# Patient Record
Sex: Male | Born: 1980 | Race: Black or African American | Hispanic: No | Marital: Married | Smoking: Never smoker
Health system: Southern US, Community
[De-identification: ages and names within clinical notes are randomized; demographics above are authoritative.]

## PROBLEM LIST (undated history)

## (undated) HISTORY — PX: FRACTURE SURGERY: SHX138

## (undated) HISTORY — PX: SPINE SURGERY: SHX786

---

## 2006-03-07 ENCOUNTER — Emergency Department (HOSPITAL_COMMUNITY): Admission: EM | Admit: 2006-03-07 | Discharge: 2006-03-07 | Payer: Self-pay | Admitting: Emergency Medicine

## 2006-03-27 ENCOUNTER — Emergency Department (HOSPITAL_COMMUNITY): Admission: EM | Admit: 2006-03-27 | Discharge: 2006-03-27 | Payer: Self-pay | Admitting: Emergency Medicine

## 2011-09-13 DIAGNOSIS — B86 Scabies: Secondary | ICD-10-CM | POA: Insufficient documentation

## 2011-09-14 ENCOUNTER — Encounter: Payer: Self-pay | Admitting: *Deleted

## 2011-09-14 ENCOUNTER — Emergency Department (HOSPITAL_COMMUNITY)
Admission: EM | Admit: 2011-09-14 | Discharge: 2011-09-14 | Disposition: A | Discharge status: Home / Self Care | Payer: Self-pay | Attending: Emergency Medicine | Admitting: Emergency Medicine

## 2011-09-14 DIAGNOSIS — B86 Scabies: Secondary | ICD-10-CM

## 2011-09-14 MED ORDER — PERMETHRIN 5 % EX CREA: TOPICAL_CREAM | CUTANEOUS | Status: AC

## 2011-09-14 NOTE — ED Notes (Signed)
Itching rash for 1- 2 weeks.

## 2011-09-14 NOTE — ED Provider Notes (Signed)
History     CSN: [id number] Arrival date & time: 09/14/2011  2:09 AM   First MD Initiated Contact with Patient 09/14/11 [phone]      Chief Complaint  Patient presents with  . Rash    (Consider location/radiation/quality/duration/timing/severity/associated sxs/prior treatment) HPI Comments: One to 2 weeks of gradual onset of itching with a scabbing-type rash to the left arm. Symptoms are constant, gradually getting worse, not associated with fevers.  Patient is a 30 y.o. male presenting with rash. The history is provided by the patient and a friend.  Rash     History reviewed. No pertinent past medical history.  Past Surgical History  Procedure Date  . Fracture surgery     History reviewed. No pertinent family history.  History  Substance Use Topics  . Smoking status: Never Smoker   . Smokeless tobacco: Not on file  . Alcohol Use: No      Review of Systems  Constitutional: Negative for fever.  Skin: Positive for rash.    Allergies  Review of patient's allergies indicates no known allergies.  Home Medications   Current Outpatient Rx  Name Route Sig Dispense Refill  . PERMETHRIN 5 % EX CREA  Apply to body X 1 application and leave on for 8-12 hours, then wash off, repeat as needed in 7 days 60 g 1    BP 130/71  Temp 97.6 F (36.4 C)  Resp 20  SpO2 98%  Physical Exam  Constitutional: He appears well-developed and well-nourished. No distress.  Eyes: Conjunctivae are normal. Right eye exhibits no discharge. Left eye exhibits no discharge. No scleral icterus.  Pulmonary/Chest: Effort normal.  Musculoskeletal: Normal range of motion. He exhibits no edema and no tenderness.  Neurological: He is alert.       Speech clear, gait normal  Skin: Rash ( Scabbing-type rash to the left upper arm on the medial surface in the left wrist. No petechiae or purpura) noted. He is not diaphoretic.    ED Course  Procedures (including critical care time)  Labs Reviewed - No  data to display No results found.   1. Scabies       MDM  Appears consistent with scabies, has associated sacral attacks with similar symptoms        Vida Roller, MD 09/14/11 [phone]

## 2013-06-23 ENCOUNTER — Encounter (HOSPITAL_COMMUNITY): Payer: Self-pay | Admitting: *Deleted

## 2013-06-23 ENCOUNTER — Emergency Department (HOSPITAL_COMMUNITY)
Admission: EM | Admit: 2013-06-23 | Discharge: 2013-06-23 | Disposition: A | Discharge status: Home / Self Care | Payer: Self-pay | Attending: Emergency Medicine | Admitting: Emergency Medicine

## 2013-06-23 DIAGNOSIS — R51 Headache: Secondary | ICD-10-CM | POA: Insufficient documentation

## 2013-06-23 DIAGNOSIS — L729 Follicular cyst of the skin and subcutaneous tissue, unspecified: Secondary | ICD-10-CM

## 2013-06-23 DIAGNOSIS — D234 Other benign neoplasm of skin of scalp and neck: Secondary | ICD-10-CM | POA: Insufficient documentation

## 2013-06-23 MED ORDER — SODIUM CHLORIDE 0.9 % IV SOLN
1000.0000 mL | INTRAVENOUS | Status: DC
  Administered 2013-06-23: 1000 mL via INTRAVENOUS

## 2013-06-23 MED ORDER — DIPHENHYDRAMINE HCL 50 MG/ML IJ SOLN
25.0000 mg | Freq: Once | INTRAMUSCULAR | Status: AC
  Administered 2013-06-23: 25 mg via INTRAVENOUS
  Filled 2013-06-23: qty 1

## 2013-06-23 MED ORDER — SODIUM CHLORIDE 0.9 % IV SOLN
1000.0000 mL | Freq: Once | INTRAVENOUS | Status: AC
  Administered 2013-06-23: 1000 mL via INTRAVENOUS

## 2013-06-23 MED ORDER — METOCLOPRAMIDE HCL 5 MG/ML IJ SOLN
10.0000 mg | Freq: Once | INTRAMUSCULAR | Status: AC
  Administered 2013-06-23: 10 mg via INTRAVENOUS
  Filled 2013-06-23: qty 2

## 2013-06-23 MED ORDER — KETOROLAC TROMETHAMINE 30 MG/ML IJ SOLN
30.0000 mg | Freq: Once | INTRAMUSCULAR | Status: AC
  Administered 2013-06-23: 30 mg via INTRAVENOUS
  Filled 2013-06-23: qty 1

## 2013-06-23 NOTE — ED Notes (Signed)
Headache with blurred vision onset 2 days ago, states he has a knot on the right side of his head that started at the same time

## 2013-06-23 NOTE — ED Notes (Signed)
Patient relates noticing soft nodule on posterior scalp 2 days ago w/progressive throbbing headache, blurred vision and slight dizziness and minimal loss of balance.  No dysphagia, dysarthia.  Grips strong and equal bilaterally.  PERRLA at 2mm.

## 2013-06-23 NOTE — ED Provider Notes (Signed)
CSN: [id number]     Arrival date & time 06/23/13  1516 History     First MD Initiated Contact with Patient 06/23/13 1531     Chief Complaint  Patient presents with  . Headache   (Consider location/radiation/quality/duration/timing/severity/associated sxs/prior Treatment) HPI  Patient states he noted a "knot" in his right scalp about 2 or 3 days ago and also he has had a headache that is right sided. He states the knot is not tender. He denies nausea, vomiting, fever, change in vision. He denies any recent tick exposure. He denies any neck pain. He states he has had boils twice in the past but not recently. He states his current pain is a "6/10". He is not taking any medications for this at home. He states he's never had this before. He denies any known trauma to his scalp.  PCP none  History reviewed. No pertinent past medical history. Past Surgical History  Procedure Laterality Date  . Fracture surgery     No family history on file. History  Substance Use Topics  . Smoking status: Never Smoker   . Smokeless tobacco: Not on file  . Alcohol Use: No  lives with spouse employed  Review of Systems  All other systems reviewed and are negative.    Allergies  Review of patient's allergies indicates no known allergies.  Home Medications  No current outpatient prescriptions on file.   BP 96/63  Pulse 70  Temp(Src) 97.7 F (36.5 C) (Oral)  Resp 20  Ht 5\' 10"  (1.778 m)  Wt 190 lb (86.183 kg)  BMI 27.26 kg/m2  SpO2 100%  Vital signs normal except borderline hypotension  Physical Exam  Nursing note and vitals reviewed. Constitutional: He is oriented to person, place, and time. He appears well-developed.  HENT:  Head: Normocephalic and atraumatic.  Mouth/Throat: Oropharynx is clear and moist.  Eyes: Conjunctivae and EOM are normal. Pupils are equal, round, and reactive to light.  Neck: Normal range of motion. Neck supple.  Pulmonary/Chest: Effort normal. No respiratory  distress.  Musculoskeletal: Normal range of motion.  Lymphadenopathy:    He has no cervical adenopathy.  Neurological: He is alert and oriented to person, place, and time. No cranial nerve deficit.  Skin: Skin is warm and dry. No rash noted. No erythema. No pallor.  Patient has a very soft cystic area in his right posterior scalp. It is nontender to palpation. There is no inflammation of the skin overlying that area. There is a mild loss of hair in that area.  Psychiatric: He has a normal mood and affect. His behavior is normal.    ED Course   Medications  0.9 %  sodium chloride infusion (0 mLs Intravenous Stopped 06/23/13 1757)    Followed by  0.9 %  sodium chloride infusion (1,000 mLs Intravenous New Bag/Given 06/23/13 1802)  metoCLOPramide (REGLAN) injection 10 mg (10 mg Intravenous Given 06/23/13 1602)  diphenhydrAMINE (BENADRYL) injection 25 mg (25 mg Intravenous Given 06/23/13 1602)  ketorolac (TORADOL) 30 MG/ML injection 30 mg (30 mg Intravenous Given 06/23/13 1602)     Procedures (including critical care time)  Recheck at time of discharge shows patient is laying on his right side with his right scalp on the pillow. He states his headache is much improved. We discussed this feels like a benign cyst but if he feels like it's causing a problem he can discuss having it removed with a Careers adviser. We had discussed prior to giving him medications that he probably was  having headaches because he's sleeping on the right side of his head with pressure on the cyst.    1. Scalp cyst   2. Headache     Plan discharge  Devoria Albe, MD, FACEP    MDM    Ward Givens, MD 06/23/13 [phone]

## 2013-10-12 ENCOUNTER — Emergency Department (HOSPITAL_COMMUNITY)
Admission: EM | Admit: 2013-10-12 | Discharge: 2013-10-12 | Disposition: A | Discharge status: Home / Self Care | Payer: Self-pay | Attending: Emergency Medicine | Admitting: Emergency Medicine

## 2013-10-12 ENCOUNTER — Encounter (HOSPITAL_COMMUNITY): Payer: Self-pay | Admitting: Emergency Medicine

## 2013-10-12 ENCOUNTER — Emergency Department (HOSPITAL_COMMUNITY): Payer: Self-pay

## 2013-10-12 DIAGNOSIS — S161XXA Strain of muscle, fascia and tendon at neck level, initial encounter: Secondary | ICD-10-CM

## 2013-10-12 DIAGNOSIS — S139XXA Sprain of joints and ligaments of unspecified parts of neck, initial encounter: Secondary | ICD-10-CM | POA: Insufficient documentation

## 2013-10-12 DIAGNOSIS — W219XXA Striking against or struck by unspecified sports equipment, initial encounter: Secondary | ICD-10-CM | POA: Insufficient documentation

## 2013-10-12 DIAGNOSIS — Y9239 Other specified sports and athletic area as the place of occurrence of the external cause: Secondary | ICD-10-CM | POA: Insufficient documentation

## 2013-10-12 DIAGNOSIS — Y9361 Activity, american tackle football: Secondary | ICD-10-CM | POA: Insufficient documentation

## 2013-10-12 MED ORDER — CYCLOBENZAPRINE HCL 10 MG PO TABS
ORAL_TABLET | ORAL | Status: AC
  Filled 2013-10-12: qty 1

## 2013-10-12 MED ORDER — CYCLOBENZAPRINE HCL 10 MG PO TABS
10.0000 mg | ORAL_TABLET | Freq: Once | ORAL | Status: AC
  Administered 2013-10-12: 10 mg via ORAL

## 2013-10-12 MED ORDER — CYCLOBENZAPRINE HCL 10 MG PO TABS: 10.0000 mg | ORAL_TABLET | Freq: Two times a day (BID) | ORAL | Status: DC | PRN

## 2013-10-12 MED ORDER — IBUPROFEN 600 MG PO TABS: 600.0000 mg | ORAL_TABLET | Freq: Four times a day (QID) | ORAL | Status: DC | PRN

## 2013-10-12 NOTE — ED Provider Notes (Signed)
Medical screening examination/treatment/procedure(s) were performed by non-physician practitioner and as supervising physician I was immediately available for consultation/collaboration.  EKG Interpretation   None         Garrell Flagg L Laiana Fratus, MD 10/12/13 1542 

## 2013-10-12 NOTE — ED Provider Notes (Signed)
CSN: [id number]     Arrival date & time 10/12/13  1015 History   First MD Initiated Contact with Patient 10/12/13 1052     Chief Complaint  Patient presents with  . Neck Injury   (Consider location/radiation/quality/duration/timing/severity/associated sxs/prior Treatment) Patient is a 32 y.o. male presenting with neck injury. The history is provided by the patient.  Neck Injury This is a new problem. The current episode started yesterday. The problem occurs constantly.   Hector Ross is a 32 y.o. male who presents to the ED with neck pain. He was playing football yesterday and was the quarterback. Another player hit him and it pulled his neck under him. He complains of pain with any movement. The pain is in the back of the neck and radiates to the right shoulder. He denies any other injuries. No headache, no LOC. No numbness of tingling.   History reviewed. No pertinent past medical history. Past Surgical History  Procedure Laterality Date  . Fracture surgery     No family history on file. History  Substance Use Topics  . Smoking status: Never Smoker   . Smokeless tobacco: Not on file  . Alcohol Use: No    Review of Systems Negative except as stated in HPI.   Allergies  Review of patient's allergies indicates no known allergies.  Home Medications  No current outpatient prescriptions on file. BP 115/63  Pulse 59  Temp(Src) 97.8 F (36.6 C) (Oral)  Resp 16  Ht 5\' 10"  (1.778 m)  Wt 196 lb (88.905 kg)  BMI 28.12 kg/m2  SpO2 97% Physical Exam  Nursing note and vitals reviewed. Constitutional: He is oriented to person, place, and time. He appears well-developed and well-nourished. No distress.  HENT:  Head: Normocephalic.  Eyes: Conjunctivae and EOM are normal.  Neck: Neck supple. Muscular tenderness present. Decreased range of motion present.    Cardiovascular: Normal rate.   Pulmonary/Chest: Effort normal.  Abdominal: Soft. There is no tenderness.    Musculoskeletal:  Radial pulses equal, adequate circulation, good touch sensation.  Neurological: He is alert and oriented to person, place, and time. He has normal strength. No cranial nerve deficit or sensory deficit. Gait normal.  Reflex Scores:      Bicep reflexes are 2+ on the right side and 2+ on the left side.      Brachioradialis reflexes are 2+ on the right side and 2+ on the left side. Skin: Skin is warm and dry.  Psychiatric: He has a normal mood and affect. His behavior is normal.    ED Course  Procedures   EKG Interpretation   None     Ct Cervical Spine Wo Contrast  10/12/2013   CLINICAL DATA:  Right posterior neck pain. Injury while playing football yesterday.  EXAM: CT CERVICAL SPINE WITHOUT CONTRAST  TECHNIQUE: Multidetector CT imaging of the cervical spine was performed without intravenous contrast. Multiplanar CT image reconstructions were also generated.  COMPARISON:  None.  FINDINGS: Imaging was obtained from the skullbase to the T2 vertebral body. No evidence for fracture there is some loss of disc height at C4-5 and C5-6 with associated endplate spurring. Facets are well aligned bilaterally. No prevertebral soft tissue edema normal cervical lordosis is straightened.  IMPRESSION: No cervical spine fracture. Loss of cervical lordosis. This can be related to patient positioning, muscle spasm or soft tissue injury.   Electronically Signed   By: Kennith Center M.D.   On: 10/12/2013 12:08      MDM  32 y.o. male with cervical strain s/p foot ball injury. Will treat with muscle relaxants and NSAIDS. Patient will follow up with Dr. Hilda Lias if symptoms do not improve.  I have reviewed this patient's vital signs, nurses notes, appropriate imaging and discussed findings and plan of care with the patient. He voices understanding.   Medication List         cyclobenzaprine 10 MG tablet  Commonly known as:  FLEXERIL  Take 1 tablet (10 mg total) by mouth 2 (two) times daily as  needed for muscle spasms.     ibuprofen 600 MG tablet  Commonly known as:  ADVIL,MOTRIN  Take 1 tablet (600 mg total) by mouth every 6 (six) hours as needed.             Hector Napoleon, NP 10/12/13 [phone]

## 2013-10-12 NOTE — ED Notes (Signed)
Patient with no complaints at this time. Respirations even and unlabored. Skin warm/dry. Discharge instructions reviewed with patient at this time. Patient given opportunity to voice concerns/ask questions. Patient discharged at this time and left Emergency Department with steady gait.   

## 2013-10-12 NOTE — ED Notes (Signed)
Pt reports his injury his neck during a football game yesterday.  Pt denies any numbness or tingling.  Pt reports pain with movement.

## 2015-05-11 ENCOUNTER — Ambulatory Visit (INDEPENDENT_AMBULATORY_CARE_PROVIDER_SITE_OTHER): Payer: Worker's Compensation | Admitting: Family Medicine

## 2015-05-11 ENCOUNTER — Encounter (INDEPENDENT_AMBULATORY_CARE_PROVIDER_SITE_OTHER): Payer: Self-pay

## 2015-05-11 ENCOUNTER — Encounter: Payer: Self-pay | Admitting: Family Medicine

## 2015-05-11 VITALS — BP 113/64 | HR 64 | Temp 97.1°F | Wt 203.0 lb

## 2015-05-11 DIAGNOSIS — S0501XA Injury of conjunctiva and corneal abrasion without foreign body, right eye, initial encounter: Secondary | ICD-10-CM

## 2015-05-11 NOTE — Progress Notes (Signed)
Subjective:  Patient ID: Hector Ross, male    DOB: [birth year]  Age: 34 y.o. MRN: [id number]  CC: eye irritation   HPI Hector Ross presents for brick fragment flew into his right eye approximately 12:30 today. No pre-existing visual illnesses noted. Patient denies blurred vision now. He feels like there is something in the eye. It was irrigated with a bottle of eyewash immediately after the occurrence. He has pain in the right eye that is mild-to-moderate. Tearing but no discharge.  History Hector Ross has no past medical history on file.   He has past surgical history that includes Fracture surgery.   His family history is not on file.He reports that he has never smoked. He does not have any smokeless tobacco history on file. He reports that he does not drink alcohol or use illicit drugs.  Outpatient Prescriptions Prior to Visit  Medication Sig Dispense Refill  . cyclobenzaprine (FLEXERIL) 10 MG tablet Take 1 tablet (10 mg total) by mouth 2 (two) times daily as needed for muscle spasms. (Patient not taking: Reported on 05/11/2015) 20 tablet 0  . ibuprofen (ADVIL,MOTRIN) 600 MG tablet Take 1 tablet (600 mg total) by mouth every 6 (six) hours as needed. (Patient not taking: Reported on 05/11/2015) 30 tablet 0   No facility-administered medications prior to visit.    ROS Review of Systems  Constitutional: Negative for fever, chills and diaphoresis.  HENT: Negative for congestion, rhinorrhea and sore throat.   Eyes: Positive for pain and redness. Negative for photophobia, discharge, itching and visual disturbance.  Cardiovascular: Negative.   Gastrointestinal: Negative.   Skin: Negative for rash.    Objective:  BP 113/64 mmHg  Pulse 64  Temp(Src) 97.1 F (36.2 C) (Oral)  Wt 203 lb (92.08 kg)  BP Readings from Last 3 Encounters:  05/11/15 113/64  10/12/13 115/63  06/23/13 96/63    Wt Readings from Last 3 Encounters:  05/11/15 203 lb (92.08 kg)  10/12/13 196 lb (88.905 kg)   06/23/13 190 lb (86.183 kg)     Physical Exam  Constitutional: He appears well-developed and well-nourished. No distress.  HENT:  Head: Normocephalic and atraumatic.  Eyes: EOM are normal. Pupils are equal, round, and reactive to light. Right eye exhibits no discharge.  Width lamp exam reveals a 1 mm/pinprick size corneal abrasion without sign of perforation at the 5:00 position in the mid zone of the cornea.    No results found for: HGBA1C  No results found for: WBC, HGB, HCT, PLT, GLUCOSE, CHOL, TRIG, HDL, LDLDIRECT, LDLCALC, ALT, AST, NA, K, CL, CREATININE, BUN, CO2, TSH, PSA, INR, GLUF, HGBA1C, MICROALBUR  Ct Cervical Spine Wo Contrast  10/12/2013   CLINICAL DATA:  Right posterior neck pain. Injury while playing football yesterday.  EXAM: CT CERVICAL SPINE WITHOUT CONTRAST  TECHNIQUE: Multidetector CT imaging of the cervical spine was performed without intravenous contrast. Multiplanar CT image reconstructions were also generated.  COMPARISON:  None.  FINDINGS: Imaging was obtained from the skullbase to the T2 vertebral body. No evidence for fracture there is some loss of disc height at C4-5 and C5-6 with associated endplate spurring. Facets are well aligned bilaterally. No prevertebral soft tissue edema normal cervical lordosis is straightened.  IMPRESSION: No cervical spine fracture. Loss of cervical lordosis. This can be related to patient positioning, muscle spasm or soft tissue injury.   Electronically Signed   By: Misty Stanley M.D.   On: 10/12/2013 12:08    Assessment & Plan:   Hector Ross  was seen today for eye irritation.  Diagnoses and all orders for this visit:  Corneal abrasion, right, initial encounter  I have discontinued Hector Ross cyclobenzaprine and ibuprofen.  No orders of the defined types were placed in this encounter.   The eye was inspected for foreign body and lid was everted without any evidence for foreign body. I was irrigated. I patch with compression  applied to the right eye. Patient was instructed to leave the eye patch in place to morning. Follow up immediately for any change in vision increased eye pain or purulent discharge.  Follow-up: Return if symptoms worsen or fail to improve.  Claretta Fraise, M.D.

## 2016-09-04 ENCOUNTER — Encounter (HOSPITAL_COMMUNITY): Payer: Self-pay | Admitting: *Deleted

## 2016-09-04 ENCOUNTER — Emergency Department (HOSPITAL_COMMUNITY)
Admission: EM | Admit: 2016-09-04 | Discharge: 2016-09-04 | Disposition: A | Discharge status: Home / Self Care | Payer: Self-pay | Attending: Emergency Medicine | Admitting: Emergency Medicine

## 2016-09-04 ENCOUNTER — Emergency Department (HOSPITAL_COMMUNITY): Payer: Self-pay

## 2016-09-04 DIAGNOSIS — R0602 Shortness of breath: Secondary | ICD-10-CM | POA: Insufficient documentation

## 2016-09-04 DIAGNOSIS — R531 Weakness: Secondary | ICD-10-CM | POA: Insufficient documentation

## 2016-09-04 DIAGNOSIS — R079 Chest pain, unspecified: Secondary | ICD-10-CM | POA: Insufficient documentation

## 2016-09-04 LAB — TROPONIN I: Troponin I: <0.03 ng/mL (ref <0.03)

## 2016-09-04 LAB — CBC
HEMATOCRIT: 41.4 % (ref 39.0–52.0)
HEMOGLOBIN: 13.6 g/dL (ref 13.0–17.0)
MCH: 27.3 pg (ref 26.0–34.0)
MCHC: 32.9 g/dL (ref 30.0–36.0)
MCV: 83 fL (ref 78.0–100.0)
Platelets: 244 10*3/uL (ref 150–400)
RBC: 4.99 MIL/uL (ref 4.22–5.81)
RDW: 13.5 % (ref 11.5–15.5)
WBC: 17.1 10*3/uL — ABNORMAL HIGH (ref 4.0–10.5)

## 2016-09-04 LAB — BASIC METABOLIC PANEL
ANION GAP: 7 (ref 5–15)
BUN: 28 mg/dL — ABNORMAL HIGH (ref 6–20)
CALCIUM: 9.3 mg/dL (ref 8.9–10.3)
CHLORIDE: 100 mmol/L — AB (ref 101–111)
CO2: 29 mmol/L (ref 22–32)
Creatinine, Ser: 1.47 mg/dL — ABNORMAL HIGH (ref 0.61–1.24)
GFR calc non Af Amer: 60 mL/min — ABNORMAL LOW (ref >60)
Glucose, Bld: 106 mg/dL — ABNORMAL HIGH (ref 65–99)
POTASSIUM: 4 mmol/L (ref 3.5–5.1)
Sodium: 136 mmol/L (ref 135–145)

## 2016-09-04 LAB — I-STAT TROPONIN, ED: TROPONIN I, POC: 0 ng/mL (ref 0.00–0.08)

## 2016-09-04 MED ORDER — HYDROCODONE-ACETAMINOPHEN 5-325 MG PO TABS
1.0000 | ORAL_TABLET | Freq: Once | ORAL | Status: AC
  Administered 2016-09-04: 1 via ORAL
  Filled 2016-09-04: qty 1

## 2016-09-04 MED ORDER — ASPIRIN 81 MG PO CHEW
324.0000 mg | CHEWABLE_TABLET | Freq: Once | ORAL | Status: AC
  Administered 2016-09-04: 324 mg via ORAL
  Filled 2016-09-04: qty 4

## 2016-09-04 MED ORDER — KETOROLAC TROMETHAMINE 30 MG/ML IJ SOLN
30.0000 mg | Freq: Once | INTRAMUSCULAR | Status: AC
  Administered 2016-09-04: 30 mg via INTRAVENOUS
  Filled 2016-09-04: qty 1

## 2016-09-04 MED ORDER — NITROGLYCERIN 0.4 MG SL SUBL
0.4000 mg | SUBLINGUAL_TABLET | SUBLINGUAL | Status: DC | PRN
  Administered 2016-09-04 (×2): 0.4 mg via SUBLINGUAL
  Filled 2016-09-04: qty 1

## 2016-09-04 MED ORDER — ALUM & MAG HYDROXIDE-SIMETH 200-200-20 MG/5ML PO SUSP
30.0000 mL | Freq: Once | ORAL | Status: AC
  Administered 2016-09-04: 30 mL via ORAL
  Filled 2016-09-04: qty 30

## 2016-09-04 MED ORDER — OMEPRAZOLE 20 MG PO CPDR: 20.0000 mg | DELAYED_RELEASE_CAPSULE | Freq: Every day | ORAL | 0 refills | Status: AC

## 2016-09-04 NOTE — ED Triage Notes (Signed)
Pt c/o chest tightness to central chest that does not radiate; pt states he has some sob, dizziness, weakness and nausea with the pain

## 2016-09-04 NOTE — ED Provider Notes (Signed)
[facility] DEPT Provider Note   CSN: [id number] Arrival date & time: 09/04/16  [id number]     History   Chief Complaint Chief Complaint  Patient presents with  . Chest Pain    HPI MANDIP FRAME is a 35 y.o. male.  The history is provided by the patient.  Chest Pain   This is a new problem. The current episode started 6 to 12 hours ago. The problem occurs constantly. The problem has not changed since onset.The pain is present in the substernal region. The pain is moderate. The quality of the pain is described as pressure-like. The pain does not radiate. Associated symptoms include shortness of breath and weakness. Pertinent negatives include no fever, no lower extremity edema and no vomiting. He has tried nothing for the symptoms. Risk factors include male gender.  Pertinent negatives for past medical history include no CAD and no PE.  Pertinent negatives for family medical history include: no CAD.  Patient reports he had onset of chest pressure last night while driving to work.  He works third shift.  He reports he was able to continue to work but still had CP and Shortness of breath He has neve had this before Denies h/o CAD/PE No recent travel/surgery   PMH - none Soc hx - denies smoking, denies drug use Family history - negative for CAD Past Surgical History:  Procedure Laterality Date  . FRACTURE SURGERY         Home Medications    Prior to Admission medications   Not on File    Family History History reviewed. No pertinent family history.  Social History Social History  Substance Use Topics  . Smoking status: Never Smoker  . Smokeless tobacco: Never Used  . Alcohol use No     Allergies   Review of patient's allergies indicates no known allergies.   Review of Systems Review of Systems  Constitutional: Positive for chills. Negative for fever.  Respiratory: Positive for shortness of breath.   Cardiovascular: Positive for chest pain.    Gastrointestinal: Negative for vomiting.  Neurological: Positive for weakness.  All other systems reviewed and are negative.    Physical Exam Updated Vital Signs BP 136/75 (BP Location: Right Arm)   Pulse 80   Temp 99.4 F (37.4 C) (Oral)   Resp 16   Ht 5\' 10"  (1.778 m)   Wt 90.7 kg   SpO2 98%   BMI 28.70 kg/m   Physical Exam CONSTITUTIONAL: Well developed/well nourished, uncomfortable appearing HEAD: Normocephalic/atraumatic EYES: EOMI/PERRL ENMT: Mucous membranes moist NECK: supple no meningeal signs SPINE/BACK:entire spine nontender CV: S1/S2 noted, no murmurs/rubs/gallops noted LUNGS: Lungs are clear to auscultation bilaterally, no apparent distress ABDOMEN: soft, nontender, no rebound or guarding, bowel sounds noted throughout abdomen GU:no cva tenderness NEURO: Pt is awake/alert/appropriate, moves all extremitiesx4.  No facial droop.   EXTREMITIES: pulses normal/equal, full ROM, no calf tenderness or edema SKIN: warm, color normal PSYCH: no abnormalities of mood noted, alert and oriented to situation   ED Treatments / Results  Labs (all labs ordered are listed, but only abnormal results are displayed) Labs Reviewed  BASIC METABOLIC PANEL - Abnormal; Notable for the following:       Result Value   Chloride 100 (*)    Glucose, Bld 106 (*)    BUN 28 (*)    Creatinine, Ser 1.47 (*)    GFR calc non Af Amer 60 (*)    All other components within normal limits  CBC -  Abnormal; Notable for the following:    WBC 17.1 (*)    All other components within normal limits  I-STAT TROPOININ, ED    EKG  EKG Interpretation  Date/Time:  Tuesday September 04 2016 05:38:46 EDT Ventricular Rate:  74 PR Interval:    QRS Duration: 94 QT Interval:  360 QTC Calculation: 400 R Axis:   63 Text Interpretation:  Sinus rhythm No previous ECGs available Confirmed by Christy Gentles  MD, [place] ([id number]) on 09/04/2016 5:42:07 AM       EKG Interpretation  Date/Time:  Tuesday September 04 2016 05:52:03 EDT Ventricular Rate:  75 PR Interval:    QRS Duration: 89 QT Interval:  359 QTC Calculation: 401 R Axis:   61 Text Interpretation:  Sinus rhythm Normal ECG Confirmed by Christy Gentles  MD, Hosie Sharman ([id number]) on 09/04/2016 5:55:00 AM       Radiology Dg Chest 2 View  Result Date: 09/04/2016 CLINICAL DATA:  Initial evaluation for acute chest pain. EXAM: CHEST  2 VIEW COMPARISON:  Prior radiograph from 03/27/2006. FINDINGS: The cardiac and mediastinal silhouettes are stable in size and contour, and remain within normal limits. The lungs are normally inflated. No airspace consolidation, pleural effusion, or pulmonary edema is identified. There is no pneumothorax. No acute osseous abnormality identified. IMPRESSION: No active cardiopulmonary disease. Electronically Signed   By: Jeannine Boga M.D.   On: 09/04/2016 06:30    Procedures Procedures   Medications Ordered in ED Medications  nitroGLYCERIN (NITROSTAT) SL tablet 0.4 mg (0.4 mg Sublingual Given 09/04/16 0611)  HYDROcodone-acetaminophen (NORCO/VICODIN) 5-325 MG per tablet 1 tablet (not administered)  aspirin chewable tablet 324 mg (324 mg Oral Given 09/04/16 0558)     Initial Impression / Assessment and Plan / ED Course  I have reviewed the triage vital signs and the nursing notes.  Pertinent labs & imaging results that were available during my care of the patient were reviewed by me and considered in my medical decision making (see chart for details).  Clinical Course    5:53 AM Pt with HEART score 1 Imaging/labs pending at this time 6:38 AM Initial workup unremarkable Pt still reports CP Will order repeat troponin after 3 hrs to assist in ruling out ACS Currently, he appears PERC negative at this time At time of signout: If repeat troponin negative, he can be discharged home with outpatient followup Final Clinical Impressions(s) / ED Diagnoses   Final diagnoses:  Chest pain, unspecified type    New  Prescriptions New Prescriptions   No medications on file     Ripley Fraise, MD 09/04/16 [phone]

## 2016-09-04 NOTE — ED Notes (Addendum)
Pt made aware to return if symptoms worsen or if any life threatening symptoms occur.  Pt states his brother is coming to pick pt up.  Pt informed and verbalized understanding that he is not to drive home with medicine that was given here today.

## 2016-09-04 NOTE — ED Provider Notes (Signed)
EKG Interpretation #2  Date/Time:  Tuesday September 04 2016 08:35:32 EDT Ventricular Rate:  76 PR Interval:    QRS Duration: 93 QT Interval:  371 QTC Calculation: 418 R Axis:   49 Text Interpretation:  Sinus rhythm No significant change was found Confirmed by Kiree Dejarnette  MD, Rayhan Groleau ([id number]) on 09/04/2016 8:39:37 AM      Second troponin negative. Well appearing. Treated with maalox and tordol. Low suspicion for ACS. Doubt PE  Primary care follow up. Home with PPI   Jola Schmidt, MD 09/04/16 [phone]

## 2016-09-06 ENCOUNTER — Emergency Department (HOSPITAL_COMMUNITY): Payer: Self-pay

## 2016-09-06 ENCOUNTER — Encounter (HOSPITAL_COMMUNITY): Payer: Self-pay | Admitting: Emergency Medicine

## 2016-09-06 ENCOUNTER — Emergency Department (HOSPITAL_COMMUNITY)
Admission: EM | Admit: 2016-09-06 | Discharge: 2016-09-06 | Disposition: A | Discharge status: Home / Self Care | Payer: Self-pay | Attending: Emergency Medicine | Admitting: Emergency Medicine

## 2016-09-06 DIAGNOSIS — R42 Dizziness and giddiness: Secondary | ICD-10-CM | POA: Insufficient documentation

## 2016-09-06 MED ORDER — MECLIZINE HCL 25 MG PO TABS: 25.0000 mg | ORAL_TABLET | Freq: Three times a day (TID) | ORAL | 0 refills | Status: AC | PRN

## 2016-09-06 MED ORDER — MECLIZINE HCL 12.5 MG PO TABS
25.0000 mg | ORAL_TABLET | Freq: Once | ORAL | Status: AC
  Administered 2016-09-06: 25 mg via ORAL
  Filled 2016-09-06: qty 2

## 2016-09-06 NOTE — ED Notes (Signed)
Pt returned from ct

## 2016-09-06 NOTE — ED Triage Notes (Signed)
Pt still c/o dizziness. Pt states he was seen for chest pain and dizziness earlier in the week.

## 2016-09-06 NOTE — Discharge Instructions (Signed)
CT scan was normal. Medication for dizziness. Rest in quiet dark room.

## 2016-09-06 NOTE — ED Notes (Signed)
Patient transported to CT 

## 2016-09-06 NOTE — ED Notes (Signed)
Pt updated on plan of care,  

## 2016-09-06 NOTE — ED Provider Notes (Signed)
[facility] DEPT Provider Note   CSN: [id number] Arrival date & time: 09/06/16  2048  By signing my name below, I, Irene Pap, attest that this documentation has been prepared under the direction and in the presence of Nat Christen, MD. Electronically Signed: Irene Pap, ED Scribe. 09/06/16. 9:16 PM.  History   Chief Complaint Chief Complaint  Patient presents with  . Dizziness   The history is provided by the patient. No language interpreter was used.   HPI Comments: Hector Ross is a 35 y.o. male who presents to the Emergency Department complaining of off balance dizziness onset two days ago. Pt was seen on 09/04/16 for the dizziness, tight chest pain, headache, and generalized myalgias. Pt continues to have symptoms that worsen with walking. He says that he was unable to go to work yesterday due to symptoms. He had mild relief earlier today with laying down. He has not taken anything for his symptoms. He has not had any recent illnesses. He denies ear pain or tinnitus.  History reviewed. No pertinent past medical history.  There are no active problems to display for this patient.   Past Surgical History:  Procedure Laterality Date  . FRACTURE SURGERY       Home Medications    Prior to Admission medications   Medication Sig Start Date End Date Taking? Authorizing Provider  meclizine (ANTIVERT) 25 MG tablet Take 1 tablet (25 mg total) by mouth 3 (three) times daily as needed for dizziness. 09/06/16   Nat Christen, MD  omeprazole (PRILOSEC) 20 MG capsule Take 1 capsule (20 mg total) by mouth daily. 09/04/16   Jola Schmidt, MD    Family History No family history on file.  Social History Social History  Substance Use Topics  . Smoking status: Never Smoker  . Smokeless tobacco: Never Used  . Alcohol use No     Allergies   Shrimp [shellfish allergy]   Review of Systems Review of Systems  HENT: Negative for ear pain and tinnitus.   Neurological: Positive  for dizziness.  All other systems reviewed and are negative.  Physical Exam Updated Vital Signs BP 115/74 (BP Location: Left Arm)   Pulse (!) 49   Temp 97.6 F (36.4 C) (Oral)   Resp 16   Ht 5\' 10"  (1.778 m)   Wt 192 lb 5 oz (87.2 kg)   SpO2 97%   BMI 27.59 kg/m   Physical Exam  Constitutional: He is oriented to person, place, and time. He appears well-developed and well-nourished.  HENT:  Head: Normocephalic and atraumatic.  Eyes: Conjunctivae are normal.  Neck: Neck supple.  Cardiovascular: Normal rate and regular rhythm.   Pulmonary/Chest: Effort normal and breath sounds normal.  Abdominal: Soft. Bowel sounds are normal.  Musculoskeletal: Normal range of motion.  Neurological: He is alert and oriented to person, place, and time. He is not disoriented. Gait normal.  Skin: Skin is warm and dry.  Psychiatric: He has a normal mood and affect. His behavior is normal.  Nursing note and vitals reviewed.  ED Treatments / Results  DIAGNOSTIC STUDIES: Oxygen Saturation is 98% on RA, normal by my interpretation.    COORDINATION OF CARE: 9:14 PM-Discussed treatment plan which includes CT scan and Anti-Vert with pt at bedside and pt agreed to plan.    Labs (all labs ordered are listed, but only abnormal results are displayed) Labs Reviewed - No data to display  EKG  EKG Interpretation None       Radiology Ct  Head Wo Contrast  Result Date: 09/06/2016 CLINICAL DATA:  Persistent vertigo for 2 days. EXAM: CT HEAD WITHOUT CONTRAST TECHNIQUE: Contiguous axial images were obtained from the base of the skull through the vertex without intravenous contrast. COMPARISON:  None available for comparison at time of study interpretation. FINDINGS: BRAIN: The ventricles and sulci are normal. No intraparenchymal hemorrhage, mass effect nor midline shift. No acute large vascular territory infarcts. No abnormal extra-axial fluid collections. Basal cisterns are patent. VASCULAR: Unremarkable.  SKULL/SOFT TISSUES: No skull fracture. 1 cm mildly expansile RIGHT frontal diploic space lucent lesion, without cortical disruption, favoring hemangioma. No significant soft tissue swelling. ORBITS/SINUSES: The included ocular globes and orbital contents are normal.The mastoid aircells and included paranasal sinuses are well-aerated. Cerumen effaces the LEFT external auditory canal. OTHER: RIGHT parieto-occipital scalp lipoma. IMPRESSION: No acute intracranial process. Electronically Signed   By: Elon Alas M.D.   On: 09/06/2016 22:29    Procedures Procedures (including critical care time)  Medications Ordered in ED Medications  meclizine (ANTIVERT) tablet 25 mg (25 mg Oral Given 09/06/16 2146)     Initial Impression / Assessment and Plan / ED Course  I have reviewed the triage vital signs and the nursing notes.  Pertinent labs & imaging results that were available during my care of the patient were reviewed by me and considered in my medical decision making (see chart for details).  Clinical Course    Patient is imaged without neurological deficits. CT head negative. Discharge medication Antivert 25 mg  Final Clinical Impressions(s) / ED Diagnoses   Final diagnoses:  Vertigo  I personally performed the services described in this documentation, which was scribed in my presence. The recorded information has been reviewed and is accurate.    New Prescriptions New Prescriptions   MECLIZINE (ANTIVERT) 25 MG TABLET    Take 1 tablet (25 mg total) by mouth 3 (three) times daily as needed for dizziness.     Nat Christen, MD 09/06/16 [id number]

## 2016-09-06 NOTE — ED Notes (Signed)
Pt c/o dizziness that is worse with standing, was seen in er a few days ago for same with no improvement in symptoms, Dr Lacinda Axon at bedside, see edp assessment for further,

## 2016-12-02 ENCOUNTER — Emergency Department (HOSPITAL_COMMUNITY)
Admission: EM | Admit: 2016-12-02 | Discharge: 2016-12-02 | Disposition: A | Discharge status: Home / Self Care | Payer: Self-pay | Attending: Emergency Medicine | Admitting: Emergency Medicine

## 2016-12-02 ENCOUNTER — Encounter (HOSPITAL_COMMUNITY): Payer: Self-pay | Admitting: Emergency Medicine

## 2016-12-02 DIAGNOSIS — R519 Headache, unspecified: Secondary | ICD-10-CM

## 2016-12-02 DIAGNOSIS — R51 Headache: Secondary | ICD-10-CM | POA: Insufficient documentation

## 2016-12-02 MED ORDER — NAPROXEN 500 MG PO TABS: 500.0000 mg | ORAL_TABLET | Freq: Two times a day (BID) | ORAL | 0 refills | Status: AC | PRN

## 2016-12-02 MED ORDER — KETOROLAC TROMETHAMINE 60 MG/2ML IM SOLN
60.0000 mg | Freq: Once | INTRAMUSCULAR | Status: AC
  Administered 2016-12-02: 60 mg via INTRAMUSCULAR
  Filled 2016-12-02: qty 2

## 2016-12-02 NOTE — ED Triage Notes (Signed)
Pt states he has had headache for the last 3 hours on and off.  C/o light sensitivity

## 2016-12-02 NOTE — ED Provider Notes (Signed)
[facility] DEPT Provider Note   CSN: [id number] Arrival date & time: 12/02/16  2124     History   Chief Complaint Chief Complaint  Patient presents with  . Headache    HPI Hector Ross is a 36 y.o. male presenting with a left sided frontal headache present for the past 3 hours and accompanied by photophobia.  He was watching a sporting event when the symptoms began.  He denies nausea, vomiting, dizziness, confusion or difficulty speaking and denies any focal weakness.  He does report having occasional mild headaches, but todays headache is different, throbbing in character and intermittent.  No personal or family history of migraines.  He has had no medicines prior to arrival for his headache.  The history is provided by the patient.    History reviewed. No pertinent past medical history.  There are no active problems to display for this patient.   Past Surgical History:  Procedure Laterality Date  . FRACTURE SURGERY         Home Medications    Prior to Admission medications   Medication Sig Start Date End Date Taking? Authorizing Provider  meclizine (ANTIVERT) 25 MG tablet Take 1 tablet (25 mg total) by mouth 3 (three) times daily as needed for dizziness. 09/06/16   Nat Christen, MD  naproxen (NAPROSYN) 500 MG tablet Take 1 tablet (500 mg total) by mouth 2 (two) times daily as needed for headache. 12/02/16   Evalee Jefferson, PA-C  omeprazole (PRILOSEC) 20 MG capsule Take 1 capsule (20 mg total) by mouth daily. 09/04/16   Jola Schmidt, MD    Family History History reviewed. No pertinent family history.  Social History Social History  Substance Use Topics  . Smoking status: Never Smoker  . Smokeless tobacco: Never Used  . Alcohol use No     Allergies   Shrimp [shellfish allergy]   Review of Systems Review of Systems  Constitutional: Negative for fever.  HENT: Negative for congestion and sore throat.   Eyes: Positive for photophobia. Negative for visual  disturbance.  Respiratory: Negative for chest tightness and shortness of breath.   Cardiovascular: Negative for chest pain.  Gastrointestinal: Negative for abdominal pain and nausea.  Genitourinary: Negative.   Musculoskeletal: Negative for arthralgias, joint swelling and neck pain.  Skin: Negative.  Negative for rash and wound.  Neurological: Positive for headaches. Negative for dizziness, speech difficulty, weakness, light-headedness and numbness.  Psychiatric/Behavioral: Negative.      Physical Exam Updated Vital Signs BP 122/78 (BP Location: Left Arm)   Pulse (!) 52   Temp 97.5 F (36.4 C) (Oral)   Resp 16   Ht 5\' 10"  (1.778 m)   Wt 93 kg   SpO2 100%   BMI 29.41 kg/m   Physical Exam  Constitutional: He is oriented to person, place, and time. He appears well-developed and well-nourished.  HENT:  Head: Normocephalic and atraumatic.  Mouth/Throat: Oropharynx is clear and moist.  Eyes: EOM are normal. Pupils are equal, round, and reactive to light.  Neck: Normal range of motion. Neck supple.  Cardiovascular: Normal rate and normal heart sounds.   Pulmonary/Chest: Effort normal.  Abdominal: Soft. There is no tenderness.  Musculoskeletal: Normal range of motion.  Lymphadenopathy:    He has no cervical adenopathy.  Neurological: He is alert and oriented to person, place, and time. He has normal strength. No sensory deficit. Gait normal. GCS eye subscore is 4. GCS verbal subscore is 5. GCS motor subscore is 6.  Normal heel-shin,  normal rapid alternating movements. Cranial nerves III-XII intact.  No pronator drift. Equal grip strength.  Skin: Skin is warm and dry. No rash noted.  Psychiatric: He has a normal mood and affect. His speech is normal and behavior is normal. Thought content normal. Cognition and memory are normal.  Nursing note and vitals reviewed.    ED Treatments / Results  Labs (all labs ordered are listed, but only abnormal results are displayed) Labs  Reviewed - No data to display  EKG  EKG Interpretation None       Radiology No results found.  Procedures Procedures (including critical care time)  Medications Ordered in ED Medications  ketorolac (TORADOL) injection 60 mg (60 mg Intramuscular Given 12/02/16 2209)     Initial Impression / Assessment and Plan / ED Course  I have reviewed the triage vital signs and the nursing notes.  Pertinent labs & imaging results that were available during my care of the patient were reviewed by me and considered in my medical decision making (see chart for details).  Clinical Course     Pt was given toradol injection.  He was sleeping upon recheck, endorsed headache almost resolved.  No neuro findings to suggest any emergent neurologic event.  Neck supple, afebrile, this is not meningitis.  No findings to suggest cva.  No h/o trauma.  Final Clinical Impressions(s) / ED Diagnoses   Final diagnoses:  Acute nonintractable headache, unspecified headache type    New Prescriptions Discharge Medication List as of 12/02/2016 11:39 PM    START taking these medications   Details  naproxen (NAPROSYN) 500 MG tablet Take 1 tablet (500 mg total) by mouth 2 (two) times daily as needed for headache., Starting Sun 12/02/2016, Print         Evalee Jefferson, PA-C 12/03/16 0120    Noemi Chapel, MD 12/04/16 1730

## 2017-06-26 IMAGING — CT CT HEAD W/O CM
3 series · 15 of 44 positions shown, 18 images · non-contrast
Comparison: None available for comparison at time of study
interpretation.

CLINICAL DATA: Persistent vertigo for 2 days.

EXAM:
CT HEAD WITHOUT CONTRAST
TECHNIQUE: Contiguous axial images were obtained from the base of the skull
through the vertex without intravenous contrast.

[Series 2: head trauma wo · axial · 0.46mm/px · z∈[+132,+242]mm · 9 of 27 slices shown, 12 images]
[im 3/27  brain]
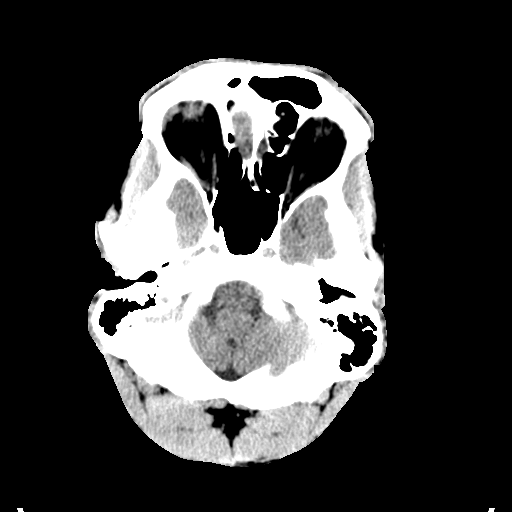
[im 3/27  bone]
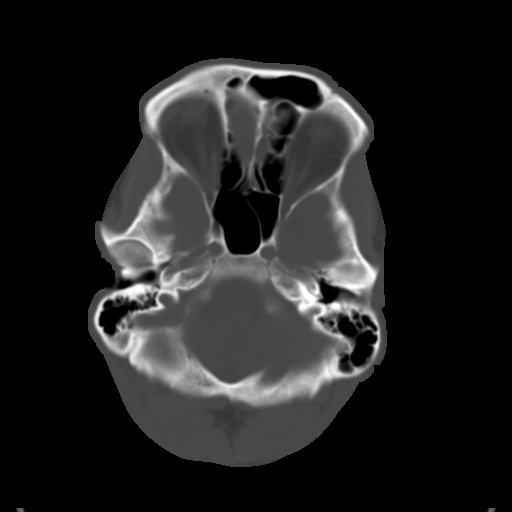
[im 6/27  brain]
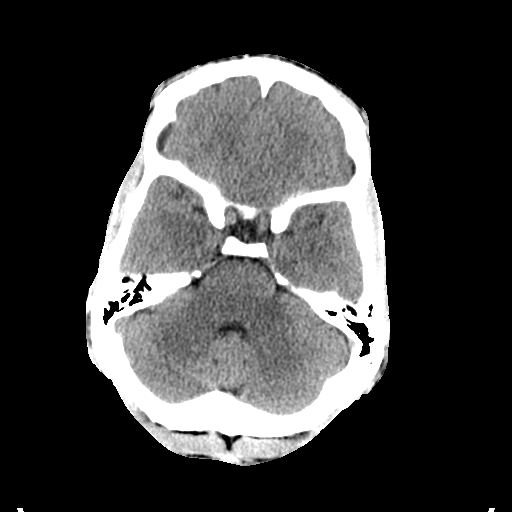
[im 8/27  brain]
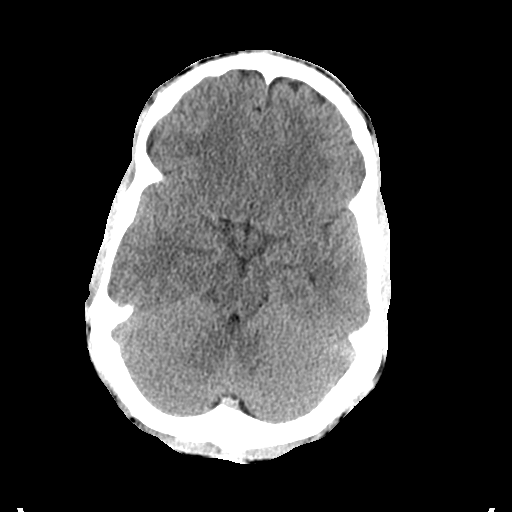
[im 11/27  brain]
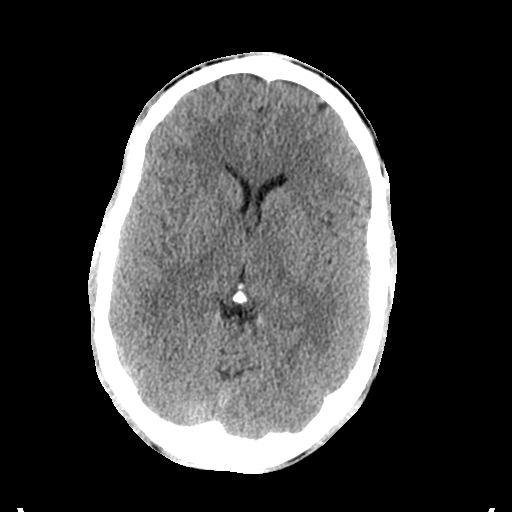
[im 14/27  brain]
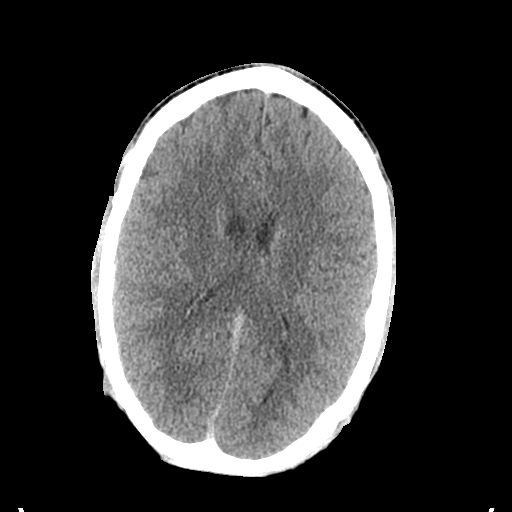
[im 14/27  bone]
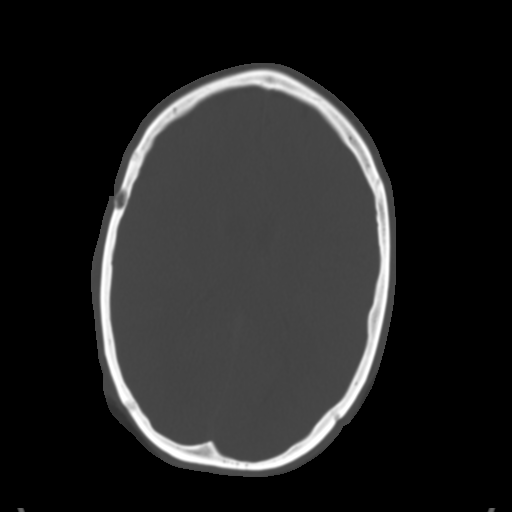
[im 17/27  brain]
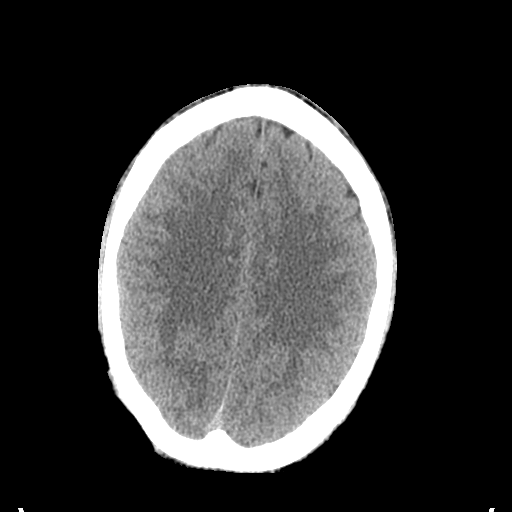
[im 20/27  brain]
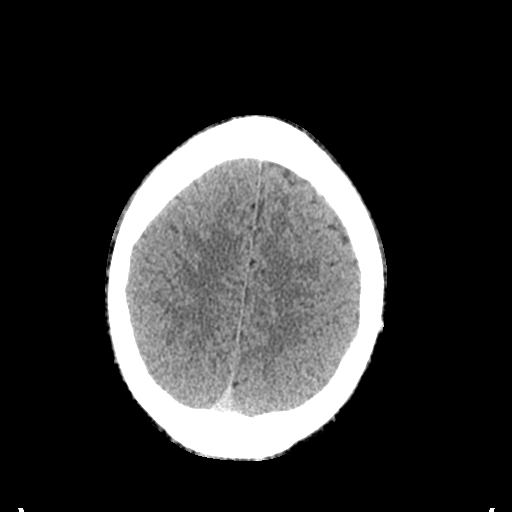
[im 22/27  brain]
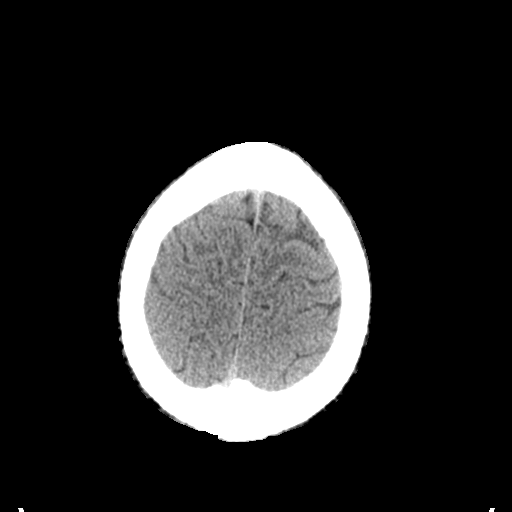
[im 25/27  brain]
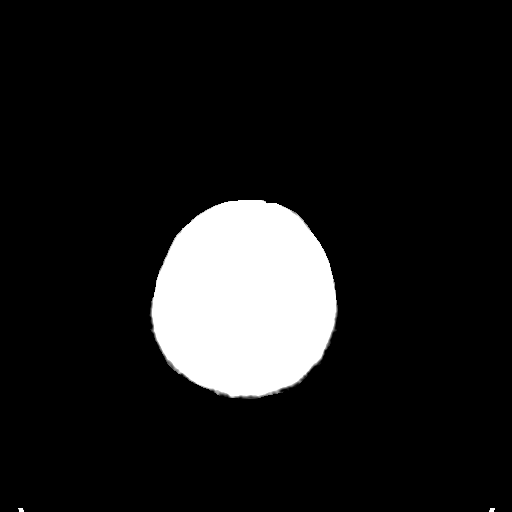
[im 25/27  bone]
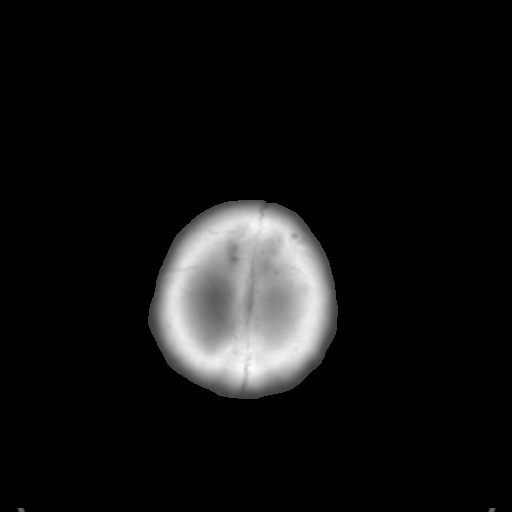

[Series 4: coronal soft tissue · coronal · 0.32mm/px · 3 of 69 slices shown]
[im 23/69  brain]
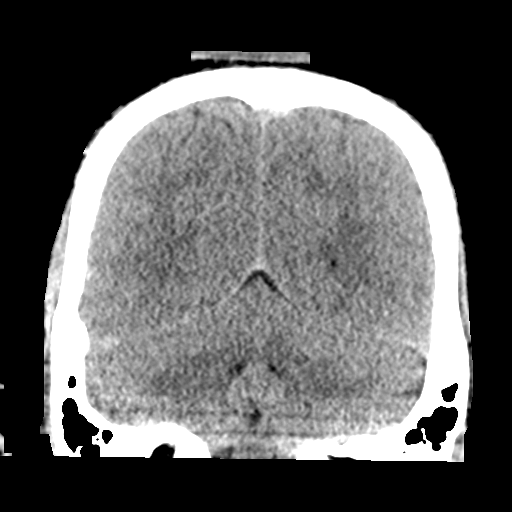
[im 31/69  brain]
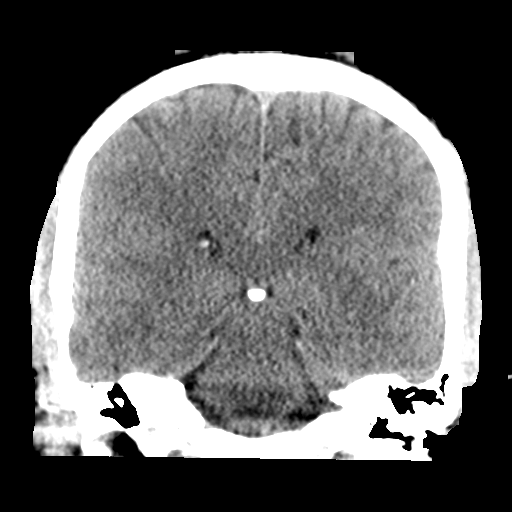
[im 38/69  brain]
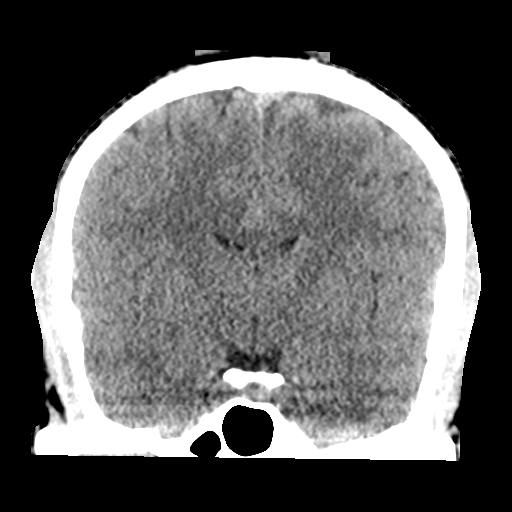

[Series 5: sagittal soft tissue · sagittal · 0.30mm/px · 3 of 54 slices shown]
[im 18/54  brain]
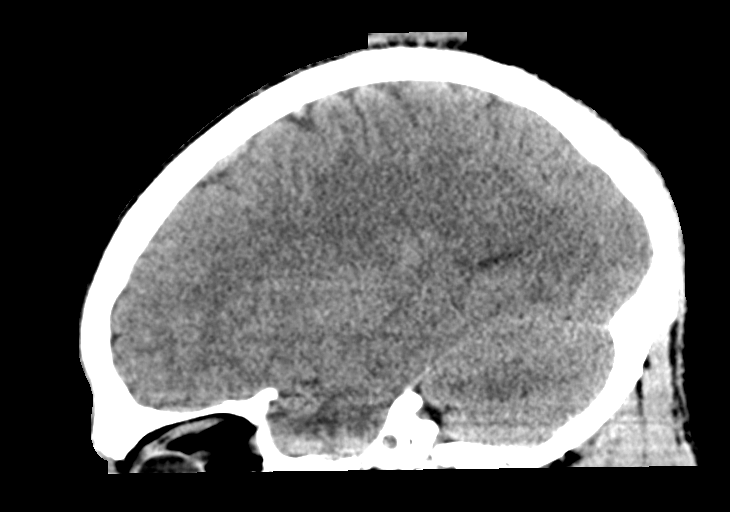
[im 27/54  brain]
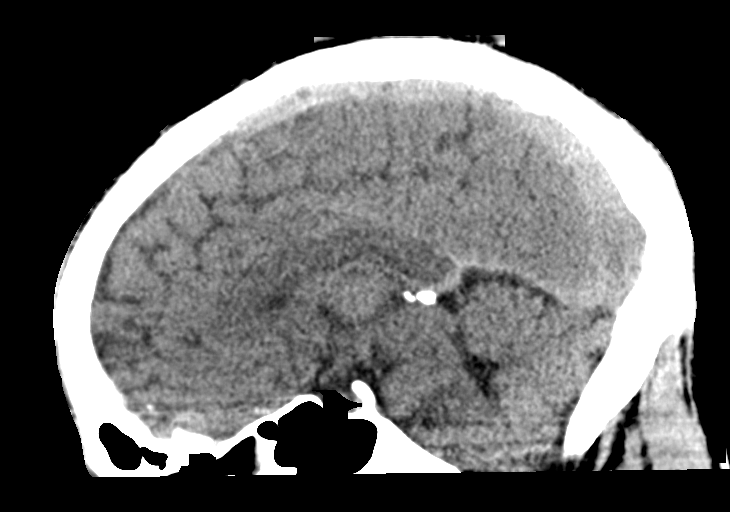
[im 36/54  brain]
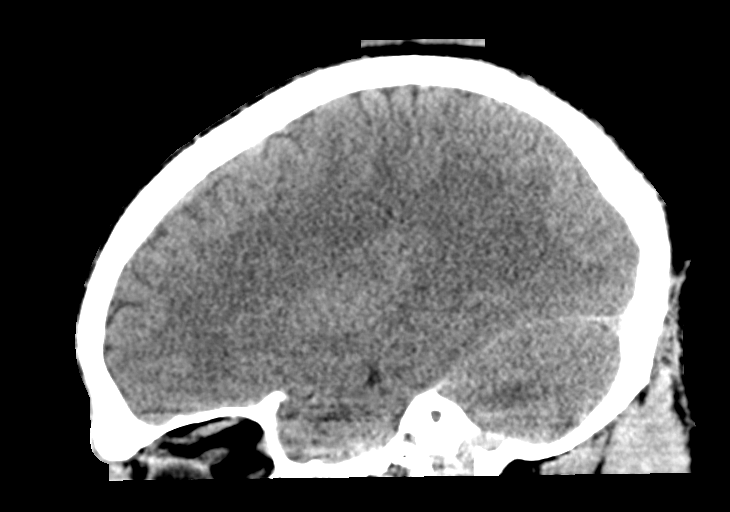

[15 of 44 positions shown; findings below may reference images not displayed]

FINDINGS: BRAIN: The ventricles and sulci are normal. No intraparenchymal
hemorrhage, mass effect nor midline shift. No acute large vascular
territory infarcts. No abnormal extra-axial fluid collections. Basal
cisterns are patent.

VASCULAR: Unremarkable.

SKULL/SOFT TISSUES: No skull fracture. 1 cm mildly expansile RIGHT
frontal diploic space lucent lesion, without cortical disruption,
favoring hemangioma. No significant soft tissue swelling.

ORBITS/SINUSES: The included ocular globes and orbital contents are
normal.The mastoid aircells and included paranasal sinuses are
well-aerated. Cerumen effaces the LEFT external auditory canal.

OTHER: RIGHT parieto-occipital scalp lipoma.
IMPRESSION: No acute intracranial process.

## 2020-05-26 ENCOUNTER — Ambulatory Visit: Payer: Self-pay

## 2020-05-26 ENCOUNTER — Ambulatory Visit (INDEPENDENT_AMBULATORY_CARE_PROVIDER_SITE_OTHER): Payer: Medicaid Other | Admitting: Orthopaedic Surgery

## 2020-05-26 ENCOUNTER — Encounter: Payer: Self-pay | Admitting: Orthopaedic Surgery

## 2020-05-26 ENCOUNTER — Other Ambulatory Visit: Payer: Self-pay

## 2020-05-26 VITALS — BP 118/78 | HR 77 | Ht 70.0 in | Wt 208.0 lb

## 2020-05-26 DIAGNOSIS — M4722 Other spondylosis with radiculopathy, cervical region: Secondary | ICD-10-CM

## 2020-05-26 DIAGNOSIS — M25512 Pain in left shoulder: Secondary | ICD-10-CM

## 2020-05-26 DIAGNOSIS — G8929 Other chronic pain: Secondary | ICD-10-CM

## 2020-05-26 DIAGNOSIS — M542 Cervicalgia: Secondary | ICD-10-CM

## 2020-05-26 NOTE — Addendum Note (Signed)
Addended by: Meyer Cory on: 05/26/2020 11:20 AM   Modules accepted: Orders

## 2020-05-26 NOTE — Progress Notes (Signed)
Office Visit Note   Patient: Hector Ross           Date of Birth: [birth year]           MRN: [id number] Visit Date: 05/26/2020              Requested by: No referring provider defined for this encounter. PCP: Neale Burly, MD   Assessment & Plan: Visit Diagnoses:  1. Neck pain   2. Chronic left shoulder pain   3. Other spondylosis with radiculopathy, cervical region     Plan: Patient had significant triceps atrophy C7 weakness and atrophy over the deltoid and rotator cuff muscles.  He needs an MRI scan of his cervical spine for his progressive weakness since he is not able to do work activities with lifting with his left arm.  Office follow-up after MRI scan for review.  Follow-Up Instructions: No follow-ups on file.   Orders:  Orders Placed This Encounter  Procedures  . XR Cervical Spine 2 or 3 views  . XR Shoulder Left   No orders of the defined types were placed in this encounter.     Procedures: No procedures performed   Clinical Data: No additional findings.   Subjective: Chief Complaint  Patient presents with  . Neck - Pain  . Left Shoulder - Pain  . Right Shoulder - Pain    HPI 39 year old male referred to me by Dr.Hasanaj with problems with progressive arm weakness ,numbness and loss of function.  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  He states the arm is progressed with weakness to the point where he is having trouble lifting with his left arm.  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  He has numbness and tingling in his hand more long finger but also involvement of the radial aspect of his hand.  No history of gout.  He has not noticed any gait disturbance no numbness or tingling in his legs.  Patient works out regularly with weights but has noticed is not able to do push-ups and has had problems with progressive  weakness in his triceps much worse on his left than right.  Patient has been treated with Naprosyn without relief.  Review of Systems 14 point systems noncontributory negative for rheumatologic conditions.   Objective: Vital Signs: BP 118/78   Pulse 77   Ht 5\' 10"  (1.778 m)   Wt 208 lb (94.3 kg)   BMI 29.84 kg/m   Physical Exam Constitutional:      Appearance: He is well-developed.  HENT:     Head: Normocephalic and atraumatic.  Eyes:     Pupils: Pupils are equal, round, and reactive to light.  Neck:     Thyroid: No thyromegaly.     Trachea: No tracheal deviation.  Cardiovascular:     Rate and Rhythm: Normal rate.  Pulmonary:     Effort: Pulmonary effort is normal.     Breath sounds: No wheezing.  Abdominal:     General: Bowel sounds are normal.     Palpations: Abdomen is soft.  Skin:    General: Skin is warm and dry.     Capillary Refill: Capillary refill takes less than 2 seconds.  Neurological:     Mental Status: He is alert and oriented to person, place, and time.  Psychiatric:  Behavior: Behavior normal.        Thought Content: Thought content normal.        Judgment: Judgment normal.     Ortho Exam patient has deltoid atrophy no pectoralis atrophy.  Supraspinatus is strong but he has weakness both internal/external rotation of the left shoulder.  Distal migration of biceps muscle suggestive of long head biceps rupture.  He has significant triceps weakness 4 out of 5 wrist flexion weakness 4 out of 5 finger extension weakness 4 out of 5.  Good wrist extension.  Significant left triceps atrophy noted.  Normal lower extremity strength normal heel toe walking no hyperreflexia no clonus.  Specialty Comments:  No specialty comments available.  Imaging: XR Cervical Spine 2 or 3 views  Result Date: 05/26/2020 2 x-ray cervical spine shows multilevel facet degenerative changes spondylosis elongation AP vertebral bodies with prominent anterior posterior spurring and  disc space narrowing this extends from C3-C7. Impression: Multilevel cervical spondylosis.  XR Shoulder Left  Result Date: 05/26/2020 Three-view x-rays left shoulder obtained and reviewed.  This shows normal glenohumeral joint.  There is noticeable soft tissue of deltoid atrophy. Impression: Left shoulder joint normal anatomy with deltoid atrophy.    PMFS History: Patient Active Problem List   Diagnosis Date Noted  . Other spondylosis with radiculopathy, cervical region 05/26/2020   No past medical history on file.  No family history on file.  Past Surgical History:  Procedure Laterality Date  . FRACTURE SURGERY     Social History   Occupational History  . Not on file  Tobacco Use  . Smoking status: Never Smoker  . Smokeless tobacco: Never Used  Substance and Sexual Activity  . Alcohol use: No  . Drug use: No  . Sexual activity: Not on file

## 2020-06-02 ENCOUNTER — Other Ambulatory Visit: Payer: Self-pay

## 2020-06-02 ENCOUNTER — Ambulatory Visit: Payer: Medicaid Other | Admitting: Orthopaedic Surgery

## 2020-06-09 ENCOUNTER — Other Ambulatory Visit: Payer: Self-pay

## 2020-06-09 ENCOUNTER — Encounter: Payer: Self-pay | Admitting: Orthopaedic Surgery

## 2020-06-09 ENCOUNTER — Ambulatory Visit (INDEPENDENT_AMBULATORY_CARE_PROVIDER_SITE_OTHER): Payer: Medicaid Other | Admitting: Orthopaedic Surgery

## 2020-06-09 DIAGNOSIS — M4802 Spinal stenosis, cervical region: Secondary | ICD-10-CM | POA: Diagnosis not present

## 2020-06-09 DIAGNOSIS — M4712 Other spondylosis with myelopathy, cervical region: Secondary | ICD-10-CM | POA: Diagnosis not present

## 2020-06-09 NOTE — Addendum Note (Signed)
Addended by: Meyer Cory on: 06/09/2020 10:34 AM   Modules accepted: Orders

## 2020-06-09 NOTE — Progress Notes (Signed)
Office Visit Note   Patient: Hector Ross           Date of Birth: 1981-09-21           MRN: [id number] Visit Date: 06/09/2020              Requested by: Neale Burly, MD [place],  [place] [id number] PCP: Neale Burly, MD   Assessment & Plan: Visit Diagnoses:  1. Spinal stenosis of cervical region   2. Other spondylosis with myelopathy, cervical region     Plan: I discussed with the patient has fairly significant problems with his neck with severe stenosis for level.  He may need anterior and posterior procedure possibly.  Would recommend referral to see Dr. Harl Bowie at [facility] due to his long severe multilevel cord compression.  Report given to the patient pathophysiology discussed.  Follow-Up Instructions: No follow-ups on file.   Orders:  No orders of the defined types were placed in this encounter.  No orders of the defined types were placed in this encounter.     Procedures: No procedures performed   Clinical Data: No additional findings.   Subjective: Chief Complaint  Patient presents with  . Neck - Pain, Follow-up    MRI cervical spine review    HPI 39 year old male returns post MRI scan cervical spine.  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  He normally works out at [employer].  He has noticed problems doing push-ups with triceps weakness worse on the left than the right but some right upper extremity weakness as well.  Problems with numbness and tingling in his hands.  MRI scan cervical spine has been obtained which shows extensive changes with myelopathic changes from C3-C7 and cord myelomalacia.  Increased T2 hyper intense signal from C3 down to C7 and he basically has 4 level stenosis.  Review of Systems all other systems are noncontributory.   Objective: Vital Signs: BP 122/73   Pulse 68   Ht 5\' 10"  (1.778 m)   Wt 208 lb  (94.3 kg)   BMI 29.84 kg/m   Physical Exam Constitutional:      Appearance: He is well-developed.  HENT:     Head: Normocephalic and atraumatic.  Eyes:     Pupils: Pupils are equal, round, and reactive to light.  Neck:     Thyroid: No thyromegaly.     Trachea: No tracheal deviation.  Cardiovascular:     Rate and Rhythm: Normal rate.  Pulmonary:     Effort: Pulmonary effort is normal.     Breath sounds: No wheezing.  Abdominal:     General: Bowel sounds are normal.     Palpations: Abdomen is soft.  Skin:    General: Skin is warm and dry.     Capillary Refill: Capillary refill takes less than 2 seconds.  Neurological:     Mental Status: He is alert and oriented to person, place, and time.  Psychiatric:        Behavior: Behavior normal.        Thought Content: Thought content normal.        Judgment: Judgment normal.     Ortho Exam patient has left deltoid atrophy no pectoralis atrophy.  Triceps weakness 4 out of 5 on the left weakness with wrist flexion finger extension all on the left greater than right deficit.  Normal lower extremity strength normal heel toe gait.  Specialty Comments:  No specialty comments available.  Imaging: MRI done at [facility] hospital  performed. No intravenous contrast was administered.  COMPARISON: 10/12/2013 CT cervical spine.  FINDINGS: Alignment: Straightening of cervical lordosis with slight reversal.  Vertebrae: Mild bone marrow edema most prominent at the C4-7 levels is likely degenerative related. No discrete fracture line.  Cord: Cord atrophy and T2 hyperintense signal spanning the C3-7 levels (3:8, 7).  Posterior Fossa, vertebral arteries: Negative.  Disc levels: Multilevel osteophytosis and Schmorl's node formation. Multilevel desiccation with mild C4-7 disc space loss.  C2-3: Disc osteophyte complex with superimposed central and left subarticular/foraminal protrusions. The central protrusion abuts  the ventral cord. Bilateral uncovertebral and facet hypertrophy. Mild spinal canal, moderate left and mild right neural foraminal narrowing.  C3-4: Disc osteophyte complex with superimposed left paracentral protrusion abutting the ventral cord, left predominant uncovertebral and facet hypertrophy. Moderate spinal canal and bilateral neural foraminal narrowing.  C4-5: Disc osteophyte complex abutting the ventral cord with uncovertebral and bilateral facet hypertrophy. Moderate spinal canal and severe bilateral neural foraminal narrowing.  C5-6: Disc osteophyte complex abutting the ventral cord with bilateral uncovertebral and facet hypertrophy. Moderate to severe spinal canal and bilateral neural foraminal narrowing.  C6-7: Disc osteophyte complex with small superimposed left subarticular protrusion abutting the ventral cord, uncovertebral and bilateral facet hypertrophy. Moderate spinal canal, severe left and moderate right neural foraminal narrowing.  C7-T1: Tiny left subarticular protrusion with left predominant uncovertebral and facet hypertrophy. Mild left neural foraminal narrowing.  Paraspinal tissues: Within normal limits.  IMPRESSION: Moderate to severe spinal canal and bilateral neural foraminal narrowing at the C3-C7 levels.  Moderate left C2-3 neural foraminal narrowing.  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   Electronically Signed By: Primitivo Gauze M.D. On: 06/02/2020 11:21    PMFS History: Patient Active Problem List   Diagnosis Date Noted  . Spinal stenosis of cervical region 06/09/2020  . Other spondylosis with myelopathy, cervical region 06/09/2020  . Other spondylosis with radiculopathy, cervical region 05/26/2020   No past medical history on file.  No family history on file.  Past Surgical History:  Procedure Laterality Date  . FRACTURE SURGERY     Social History   Occupational History  . Not on file  Tobacco Use  . Smoking status: Never  Smoker  . Smokeless tobacco: Never Used  Substance and Sexual Activity  . Alcohol use: No  . Drug use: No  . Sexual activity: Not on file

## 2020-07-18 ENCOUNTER — Other Ambulatory Visit (HOSPITAL_BASED_OUTPATIENT_CLINIC_OR_DEPARTMENT_OTHER): Payer: Self-pay

## 2020-07-18 DIAGNOSIS — G4733 Obstructive sleep apnea (adult) (pediatric): Secondary | ICD-10-CM

## 2020-08-07 ENCOUNTER — Ambulatory Visit: Payer: Medicaid Other | Attending: Neurology | Admitting: Neurology

## 2020-08-07 ENCOUNTER — Other Ambulatory Visit: Payer: Self-pay

## 2020-08-07 DIAGNOSIS — G479 Sleep disorder, unspecified: Secondary | ICD-10-CM | POA: Diagnosis not present

## 2020-08-07 DIAGNOSIS — Z79899 Other long term (current) drug therapy: Secondary | ICD-10-CM | POA: Diagnosis not present

## 2020-08-07 DIAGNOSIS — G4733 Obstructive sleep apnea (adult) (pediatric): Secondary | ICD-10-CM | POA: Insufficient documentation

## 2020-08-15 NOTE — Procedures (Signed)
   [place] A. Merlene Laughter, MD     [email or web address]             NOCTURNAL POLYSOMNOGRAPHY   LOCATION: [facility]   Patient Name: Hector Ross, Hector Ross Date: 08/07/2020 Gender: Male D.O.B: May 19, 1981 Age (years): 72 Referring Provider: Barton Fanny NP Height (inches): 70 Interpreting Physician: Phillips Odor MD, ABSM Weight (lbs): 208 RPSGT: Rosebud Poles BMI: 30 MRN: [id number] Neck Size: 18.00 CLINICAL INFORMATION Sleep Study Type: NPSG     Indication for sleep study: N/A     Epworth Sleepiness Score: 22     SLEEP STUDY TECHNIQUE As per the AASM Manual for the Scoring of Sleep and Associated Events v2.3 (April 2016) with a hypopnea requiring 4% desaturations.  The channels recorded and monitored were frontal, central and occipital EEG, electrooculogram (EOG), submentalis EMG (chin), nasal and oral airflow, thoracic and abdominal wall motion, anterior tibialis EMG, snore microphone, electrocardiogram, and pulse oximetry.  MEDICATIONS Medications self-administered by patient taken the night of the study : N/A.medsc  Current Outpatient Medications:  .  meclizine (ANTIVERT) 25 MG tablet, Take 1 tablet (25 mg total) by mouth 3 (three) times daily as needed for dizziness. (Patient not taking: Reported on 05/26/2020), Disp: 15 tablet, Rfl: 0 .  naproxen (NAPROSYN) 500 MG tablet, Take 1 tablet (500 mg total) by mouth 2 (two) times daily as needed for headache. (Patient not taking: Reported on 05/26/2020), Disp: 30 tablet, Rfl: 0 .  omeprazole (PRILOSEC) 20 MG capsule, Take 1 capsule (20 mg total) by mouth daily. (Patient not taking: Reported on 05/26/2020), Disp: 30 capsule, Rfl: 0      SLEEP ARCHITECTURE The study was initiated at 10:05:07 PM and ended at 5:06:25 AM.  Sleep onset time was 209.2 minutes and the sleep efficiency was 48.8%%. The total sleep time was 205.6 minutes.  Stage REM latency was 85.5 minutes.  The patient spent 1.0%% of the  night in stage N1 sleep, 41.4%% in stage N2 sleep, 36.2%% in stage N3 and 21.4% in REM.  Alpha intrusion was absent.  Supine sleep was 29.92%.  RESPIRATORY PARAMETERS The overall apnea/hypopnea index (AHI) was 11.1 per hour. There were 0 total apneas, including 0 obstructive, 0 central and 0 mixed apneas. There were 38 hypopneas and 0 RERAs.  The AHI during Stage REM sleep was 2.7 per hour.  AHI while supine was 31.2 per hour.  The mean oxygen saturation was 91.6%. The minimum SpO2 during sleep was 81.0%.  moderate snoring was noted during this study.  CARDIAC DATA The 2 lead EKG demonstrated sinus rhythm. The mean heart rate was 47.0 beats per minute. Other EKG findings include: None.  LEG MOVEMENT DATA The total PLMS were 0 with a resulting PLMS index of 0.0. Associated arousal with leg movement index was 0.0.  IMPRESSIONS 1. Mild to moderate obstructive sleep apnea is documented with this study.  AutoPAP 8-14 is recommended. 2. Abnormal sleep architecture is also noted with poor sleep efficiency.   Delano Metz, MD Diplomate, American Board of Sleep Medicine.  ELECTRONICALLY SIGNED ON:  08/15/2020, 5:39 PM [place] PH: [phone]   FX: [phone] [place]

## 2021-02-09 ENCOUNTER — Ambulatory Visit: Payer: Medicaid Other | Attending: Critical Care Medicine

## 2021-02-09 DIAGNOSIS — Z20822 Contact with and (suspected) exposure to covid-19: Secondary | ICD-10-CM

## 2021-02-10 LAB — SARS-COV-2, NAA 2 DAY TAT

## 2021-02-10 LAB — NOVEL CORONAVIRUS, NAA: SARS-CoV-2, NAA: NOT DETECTED

## 2022-06-24 DIAGNOSIS — M79671 Pain in right foot: Secondary | ICD-10-CM

## 2022-06-24 NOTE — Discharge Instructions (Signed)
Please follow-up with pain management.

## 2022-06-24 NOTE — ED Provider Notes (Signed)
THE [facility]  EMERGENCY DEPARTMENT ENCOUNTER          ATTENDING PHYSICIAN NOTE       Date of evaluation: 06/24/2022    Chief Complaint     Foot Pain (Pt. Presents to ED with c/o chronic right foot pain that has worsened over the past few days. )      History of Present Illness     Eric Calhoun is a 41 y.o. male who presents right foot pain.  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  States that he normally takes oxycodone 30 mg 3-4 times a day for pain relief.  States that he is in the process of moving up to [place] and states that he left his pain medication back in [place] where he normally lives.  States that he has a appointment with a pain specialist on Wednesday.  States that he is here today because his pain has worsened over the past few days.  Denies any fevers.  Denies any recent trauma to the foot.  He states that he cannot take NSAIDs given the vascular malformations on his foot which is why he is on oxy.      Physical Exam     INITIAL VITALS: BP: (!) 159/118, Temp: 98.4 F (36.9 C), Pulse: 94, Respirations: 16, SpO2: 99 %   Physical Exam  Vitals and nursing note reviewed.   Constitutional:       General: He is not in acute distress.     Appearance: He is not ill-appearing.   Cardiovascular:      Rate and Rhythm: Normal rate.   Abdominal:      Tenderness: There is no abdominal tenderness. There is no guarding or rebound.   Skin:     General: Skin is warm and dry.      Comments: Multiple small tumors growths to the instep of the patient's right foot that are tender to palpation.  No overlying erythema.   Neurological:      General: No focal deficit present.      Mental Status: He is alert. Mental status is at baseline.   Psychiatric:         Mood and Affect: Mood normal.         Thought Content: Thought content normal.       ASSESSMENT / PLAN  (MEDICAL DECISION MAKING)     INITIAL VITALS: BP: (!) 159/118, Temp: 98.4 F (36.9 C),  Pulse: 94, Respirations: 16, SpO2: 99 %      Eric Calhoun is a 41 y.o. male who presents with right foot pain.  He does appear to have multiple small tumors gross to the instep of his right foot that appear tender to palpation.  I do not have any prior records on this patient.  He does state that he has a pain management appointment on Wednesday.  He does not appear grossly infected at this time.    I reviewed the PDMP for this patient.  It appears that he has had multiple prescriptions for pain medication over the past few days.  Appears that he was prescribed Percocet on 7/26 for 3 days, oxycodone 5 mg for 3 days on 7/27, 14 days of Oxy on the 28th as well as 14 days of Oxy on the second.  He states that he left all of his medication when he was down in [place].    Patient  likely does have chronic pain given the malformation on his foot however this does appear to be a concerning trend for this patient.  I do not see any other ER visits for pain in his chart.  I discussed these results with the patient at the bedside.  After discussions with him I informed him that I would not be prescribing him any pain medication to go home with but I would give him a single dose of oxycodone 30 mg here in the emergency department given that that is what he was previously prescribed.  I strongly encouraged him to follow-up with pain management on Wednesday.  Do not feel the patient warrants further pain medication at this time or further work-up given his normal vital signs, normal physical exam and no other signs of trauma or infection.  Patient was agreement and nursing this plan.  Stable for outpatient management.         Is this patient to be included in the SEP-1 core measure? No Exclusion criteria - the patient is NOT to be included for SEP-1 Core Measure due to: Infection is not suspected    Medical Decision Making  Problems Addressed:  Right foot pain: chronic illness or injury with exacerbation, progression, or side effects  of treatment    Amount and/or Complexity of Data Reviewed  External Data Reviewed: notes.    Risk  Prescription drug management.        Clinical Impression     1. Right foot pain        Disposition     PATIENT REFERRED TO:  No follow-up provider specified.    DISCHARGE MEDICATIONS:  New Prescriptions    No medications on file       DISPOSITION Decision To Discharge 06/24/2022 09:41:36 PM        Diagnostic Results and Other Data       RADIOLOGY:  No orders to display       LABS:   No results found for this visit on 06/24/22.  EKG     ED BEDSIDE ULTRASOUND:  No results found.    MOST RECENT VITALS:  BP: (!) 159/118,Temp: 98.4 F (36.9 C), Pulse: 94, Respirations: 16, SpO2: 99 %     Procedures         ED Course     Nursing Notes, Past Medical Hx, Past Surgical Hx, Social Hx,Allergies, and Family Hx were reviewed.    The patient was given the following medications:  Orders Placed This Encounter   Medications    DISCONTD: HYDROmorphone (DILAUDID) injection 1.5 mg    oxyCODONE (OXY-IR) immediate release tablet 30 mg       CONSULTS:  None    Review of Systems     Review of Systems    Past Medical, Surgical, Family, and Social History     He has no past medical history on file.  He has no past surgical history on file.  His family history is not on file.  He reports that he has never smoked. He has never used smokeless tobacco. He reports that he does not currently use alcohol. He reports that he does not use drugs.    Medications     Previous Medications    OXYCODONE (OXY-IR) 30 MG IMMEDIATE RELEASE TABLET    Take 1 tablet by mouth every 6 hours as needed for Pain. Max Daily Amount: 120 mg       Allergies     He has No Known Allergies.  Rickey Primus, MD  06/24/22 2142

## 2022-06-24 NOTE — ED Notes (Signed)
Patient not in room when RN entered to give d/c paperwork and obtain d/c vital signs.      Charlynn Court, RN  06/24/22 2200

## 2022-06-25 ENCOUNTER — Inpatient Hospital Stay
Admit: 2022-06-25 | Discharge: 2022-06-25 | Disposition: A | Discharge status: Home / Self Care | Attending: Emergency Medicine

## 2022-06-25 MED ORDER — OXYCODONE HCL 15 MG PO TABS
15 MG | Freq: Once | ORAL | Status: AC
Start: 2022-06-25 — End: 2022-06-24
  Administered 2022-06-25: 02:00:00 30 mg via ORAL

## 2022-06-25 MED ORDER — HYDROMORPHONE HCL 1 MG/ML IJ SOLN
1 MG/ML | Freq: Once | INTRAMUSCULAR | Status: DC
Start: 2022-06-25 — End: 2022-06-24

## 2022-06-25 MED FILL — OXYCODONE HCL 15 MG PO TABS: 15 MG | ORAL | Qty: 2

## 2023-06-20 ENCOUNTER — Other Ambulatory Visit: Payer: Self-pay

## 2023-06-20 ENCOUNTER — Emergency Department (HOSPITAL_COMMUNITY): Payer: Medicaid Other

## 2023-06-20 ENCOUNTER — Emergency Department (HOSPITAL_COMMUNITY)
Admission: EM | Admit: 2023-06-20 | Discharge: 2023-06-20 | Disposition: A | Discharge status: Home / Self Care | Payer: Medicaid Other | Attending: Emergency Medicine | Admitting: Emergency Medicine

## 2023-06-20 ENCOUNTER — Encounter (HOSPITAL_COMMUNITY): Payer: Self-pay | Admitting: Emergency Medicine

## 2023-06-20 DIAGNOSIS — Y9367 Activity, basketball: Secondary | ICD-10-CM | POA: Diagnosis not present

## 2023-06-20 DIAGNOSIS — M7031 Other bursitis of elbow, right elbow: Secondary | ICD-10-CM | POA: Insufficient documentation

## 2023-06-20 DIAGNOSIS — M25521 Pain in right elbow: Secondary | ICD-10-CM | POA: Diagnosis present

## 2023-06-20 LAB — CBC
HCT: 40.8 % (ref 39.0–52.0)
Hemoglobin: 13.1 g/dL (ref 13.0–17.0)
MCH: 27.8 pg (ref 26.0–34.0)
MCHC: 32.1 g/dL (ref 30.0–36.0)
MCV: 86.6 fL (ref 80.0–100.0)
Platelets: 219 10*3/uL (ref 150–400)
RBC: 4.71 MIL/uL (ref 4.22–5.81)
RDW: 13.2 % (ref 11.5–15.5)
WBC: 9.4 10*3/uL (ref 4.0–10.5)
nRBC: 0 % (ref 0.0–0.2)

## 2023-06-20 LAB — C-REACTIVE PROTEIN: CRP: 0.5 mg/dL (ref <1.0)

## 2023-06-20 LAB — DIFFERENTIAL
Abs Immature Granulocytes: 0.02 10*3/uL (ref 0.00–0.07)
Basophils Absolute: 0 10*3/uL (ref 0.0–0.1)
Basophils Relative: 0 %
Eosinophils Absolute: 0.1 10*3/uL (ref 0.0–0.5)
Eosinophils Relative: 1 %
Immature Granulocytes: 0 %
Lymphocytes Relative: 19 %
Lymphs Abs: 1.8 10*3/uL (ref 0.7–4.0)
Monocytes Absolute: 0.8 10*3/uL (ref 0.1–1.0)
Monocytes Relative: 8 %
Neutro Abs: 6.7 10*3/uL (ref 1.7–7.7)
Neutrophils Relative %: 72 %

## 2023-06-20 LAB — BASIC METABOLIC PANEL
Anion gap: 7 (ref 5–15)
BUN: 20 mg/dL (ref 6–20)
CO2: 27 mmol/L (ref 22–32)
Calcium: 9.2 mg/dL (ref 8.9–10.3)
Chloride: 103 mmol/L (ref 98–111)
Creatinine, Ser: 1.02 mg/dL (ref 0.61–1.24)
GFR, Estimated: >60 mL/min (ref >60)
Glucose, Bld: 94 mg/dL (ref 70–99)
Potassium: 4.1 mmol/L (ref 3.5–5.1)
Sodium: 137 mmol/L (ref 135–145)

## 2023-06-20 LAB — SEDIMENTATION RATE: Sed Rate: 2 mm/hr (ref 0–16)

## 2023-06-20 MED ORDER — LIDOCAINE HCL (PF) 1 % IJ SOLN
5.0000 mL | Freq: Once | INTRAMUSCULAR | Status: DC
  Filled 2023-06-20: qty 5

## 2023-06-20 MED ORDER — IBUPROFEN 800 MG PO TABS
800.0000 mg | ORAL_TABLET | Freq: Once | ORAL | Status: AC
  Administered 2023-06-20: 800 mg via ORAL
  Filled 2023-06-20: qty 1

## 2023-06-20 MED ORDER — HYDROCODONE-ACETAMINOPHEN 5-325 MG PO TABS
1.0000 | ORAL_TABLET | Freq: Once | ORAL | Status: AC
  Administered 2023-06-20: 1 via ORAL
  Filled 2023-06-20: qty 1

## 2023-06-20 MED ORDER — OXYCODONE HCL 5 MG PO TABS: 5.0000 mg | ORAL_TABLET | ORAL | 0 refills | Status: AC | PRN

## 2023-06-20 NOTE — ED Triage Notes (Signed)
Pt reports pain and swelling to right elbow with inability to bend it since yesterday, pt unaware of known injury

## 2023-06-20 NOTE — ED Provider Notes (Signed)
[place] EMERGENCY DEPARTMENT AT [facility] Provider Note   CSN: [id number] Arrival date & time: 06/20/23  1508     History  Chief Complaint  Patient presents with   Elbow Pain    Hector Ross is a 42 y.o. male.  42 year old male who presents emergency department with right elbow pain.  Reports that last night started having some pain in his right elbow, progressively worse.  Today feels like it is swollen.  No fevers.  Does not feel like his elbow is warm.  No trauma to the elbow.  Says that he worked out and then shot basketball on Monday but does not remember any other excessive use or injuries.  No surgeries to that arm.  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       Home Medications Prior to Admission medications   Medication Sig Start Date End Date Taking? Authorizing Provider  oxyCODONE (ROXICODONE) 5 MG immediate release tablet Take 1 tablet (5 mg total) by mouth every 4 (four) hours as needed for severe pain. 06/20/23  Yes Rondel Baton, MD  meclizine (ANTIVERT) 25 MG tablet Take 1 tablet (25 mg total) by mouth 3 (three) times daily as needed for dizziness. Patient not taking: Reported on 05/26/2020 09/06/16   Donnetta Hutching, MD  naproxen (NAPROSYN) 500 MG tablet Take 1 tablet (500 mg total) by mouth 2 (two) times daily as needed for headache. Patient not taking: Reported on 05/26/2020 12/02/16   Burgess Amor, PA-C  omeprazole (PRILOSEC) 20 MG capsule Take 1 capsule (20 mg total) by mouth daily. Patient not taking: Reported on 05/26/2020 09/04/16   Azalia Bilis, MD      Allergies    Shrimp [shellfish allergy]    Review of Systems   Review of Systems  Physical Exam Updated Vital Signs BP 123/77 (BP Location: Left Arm)   Pulse (!) 52   Temp 98.7 F (37.1 C) (Oral)   Resp 16   Ht 5\' 10"  (1.778 m)   Wt 93 kg   SpO2 99%   BMI 29.41 kg/m  Physical Exam Vitals and nursing note reviewed.  Constitutional:      General: He is not in acute  distress.    Appearance: He is well-developed.  HENT:     Head: Normocephalic and atraumatic.     Right Ear: External ear normal.     Left Ear: External ear normal.     Nose: Nose normal.  Eyes:     Extraocular Movements: Extraocular movements intact.     Conjunctiva/sclera: Conjunctivae normal.     Pupils: Pupils are equal, round, and reactive to light.  Cardiovascular:     Rate and Rhythm: Normal rate and regular rhythm.  Pulmonary:     Effort: Pulmonary effort is normal.  Musculoskeletal:     Cervical back: Normal range of motion and neck supple.     Right lower leg: No edema.     Left lower leg: No edema.     Comments: Right elbow with minimal amount of swelling on the right lateral epicondyle.  Minimal warmth.  Mild amount of erythema.  Patient able to range elbow from fully straight to 90 degrees but does have some pain.  No appreciable joint effusion able to be palpated.  Skin:    General: Skin is warm and dry.  Neurological:     Mental Status: He is alert. Mental status is at baseline.  Psychiatric:  Mood and Affect: Mood normal.        Behavior: Behavior normal.     ED Results / Procedures / Treatments   Labs (all labs ordered are listed, but only abnormal results are displayed) Labs Reviewed  BASIC METABOLIC PANEL  CBC  DIFFERENTIAL  SEDIMENTATION RATE  C-REACTIVE PROTEIN    EKG None  Radiology DG Elbow Complete Right  Result Date: 06/20/2023 CLINICAL DATA:  Right elbow pain and swelling. EXAM: RIGHT ELBOW - COMPLETE 3+ VIEW COMPARISON:  None Available. FINDINGS: There is no evidence of fracture, dislocation, or joint effusion. There is no evidence of arthropathy or other focal bone abnormality. Soft tissues are unremarkable. IMPRESSION: Negative. Electronically Signed   By: Lupita Raider M.D.   On: 06/20/2023 16:25    Procedures Procedures   EMERGENCY DEPARTMENT [place] ELBOW INTERPRETATION "Study: Limited Soft Tissue Ultrasound"  INDICATIONS:   Pain Multiple views of the body part were obtained in real-time with a multi-frequency linear probe  PERFORMED BY: Myself IMAGES ARCHIVED?: No SIDE:Right  BODY PART: Elbow INTERPRETATION:   No joint effusion noted, no fluid collections    Medications Ordered in ED Medications  HYDROcodone-acetaminophen (NORCO/VICODIN) 5-325 MG per tablet 1 tablet (1 tablet Oral Given 06/20/23 1647)  ibuprofen (ADVIL) tablet 800 mg (800 mg Oral Given 06/20/23 1647)    ED Course/ Medical Decision Making/ A&P                                 Medical Decision Making Amount and/or Complexity of Data Reviewed Labs: ordered. Radiology: ordered.  Risk Prescription drug management.   Hector Ross is a 42 y.o. male who is right-handed who presents emergency department with right elbow pain  Initial Ddx:  Bursitis, septic joint, inflammatory arthritis  MDM/Course:  Patient presents to the emergency department with atraumatic pain of the right elbow.  Does have small amount of erythema over the right elbow but no significant warmth.  Is able to range his elbow to approximately 90 degrees.  Point-of-care ultrasound did not show any effusion that would be amenable to joint aspiration.  X-ray without effusion or other abnormality.  Patient had lab work that showed totally normal inflammatory markers.  As such feel that inflammatory arthritis or septic arthritis is highly unlikely.  Upon re-evaluation was feeling better after the Norco and ibuprofen that he was given.  Instructed to follow-up with orthopedic surgery and take Tylenol, ibuprofen, and oxycodone for any pain that he should have.  Return precautions discussed prior to discharge for symptoms that would be concerning for septic joint.  This patient presents to the ED for concern of complaints listed in HPI, this involves an extensive number of treatment options, and is a complaint that carries with it a high risk of complications and morbidity.  Disposition including potential need for admission considered.   Dispo: DC Home. Return precautions discussed including, but not limited to, those listed in the AVS. Allowed pt time to ask questions which were answered fully prior to dc.  Records reviewed Outpatient Clinic Notes The following labs were independently interpreted: CBC and show no acute abnormality I independently reviewed the following imaging with scope of interpretation limited to determining acute life threatening conditions related to emergency care: Extremity x-ray(s) and agree with the radiologist interpretation with the following exceptions: none I personally reviewed and interpreted cardiac monitoring: normal sinus rhythm  I have reviewed the patients home medications  and made adjustments as needed       Final Clinical Impression(s) / ED Diagnoses Final diagnoses:  Bursitis of other bursa of right elbow    Rx / DC Orders ED Discharge Orders          Ordered    oxyCODONE (ROXICODONE) 5 MG immediate release tablet  Every 4 hours PRN        06/20/23 1957              Rondel Baton, MD 06/21/23 1140

## 2023-06-20 NOTE — Discharge Instructions (Signed)
You were seen for your elbow pain in the emergency department.   At home, please ice your elbow and use Tylenol and ibuprofen for your pain. You may also take the oxycodone we have prescribed you for any breakthrough pain that may have.  Do not take this before driving or operating heavy machinery.  Do not take this medication with alcohol.    Check your MyChart online for the results of any tests that had not resulted by the time you left the emergency department.   Orthopedics regarding your elbow.  Return immediately to the emergency department if you experience any of the following: Fevers, worsening swelling, or any other concerning symptoms.    Thank you for visiting our Emergency Department. It was a pleasure taking care of you today.

## 2024-09-25 ENCOUNTER — Inpatient Hospital Stay: Admit: 2024-09-25 | Discharge: 2024-09-25 | Arrived: WI

## 2024-09-25 DIAGNOSIS — Z5329 Procedure and treatment not carried out because of patient's decision for other reasons: Principal | ICD-10-CM
